# Patient Record
Sex: Male | Born: 1950 | Race: White | Hispanic: No | Marital: Married
Health system: Southern US, Community
[De-identification: ages and names within clinical notes are randomized; demographics above are authoritative.]

---

## 2003-12-18 ENCOUNTER — Other Ambulatory Visit: Payer: Self-pay

## 2006-09-07 ENCOUNTER — Other Ambulatory Visit: Payer: Self-pay

## 2006-09-07 ENCOUNTER — Emergency Department: Payer: Self-pay | Admitting: Emergency Medicine

## 2008-02-13 IMAGING — CR DG CHEST 1V PORT
1 series · 2 of 2 positions shown · non-contrast
Comparison: none

REASON FOR EXAM: cp
COMMENTS:

PROCEDURE:     DXR - DXR PORTABLE CHEST SINGLE VIEW  - September 07, 2006 [DATE]
RESULT:     AP view of the chest shows the lung fields to be clear.  The
heart, mediastinal and osseous structures reveal no significant
abnormalities.

[Series 1: view not recorded · 0.17mm/px · 2 of 2 slices shown]
[im 1/2]
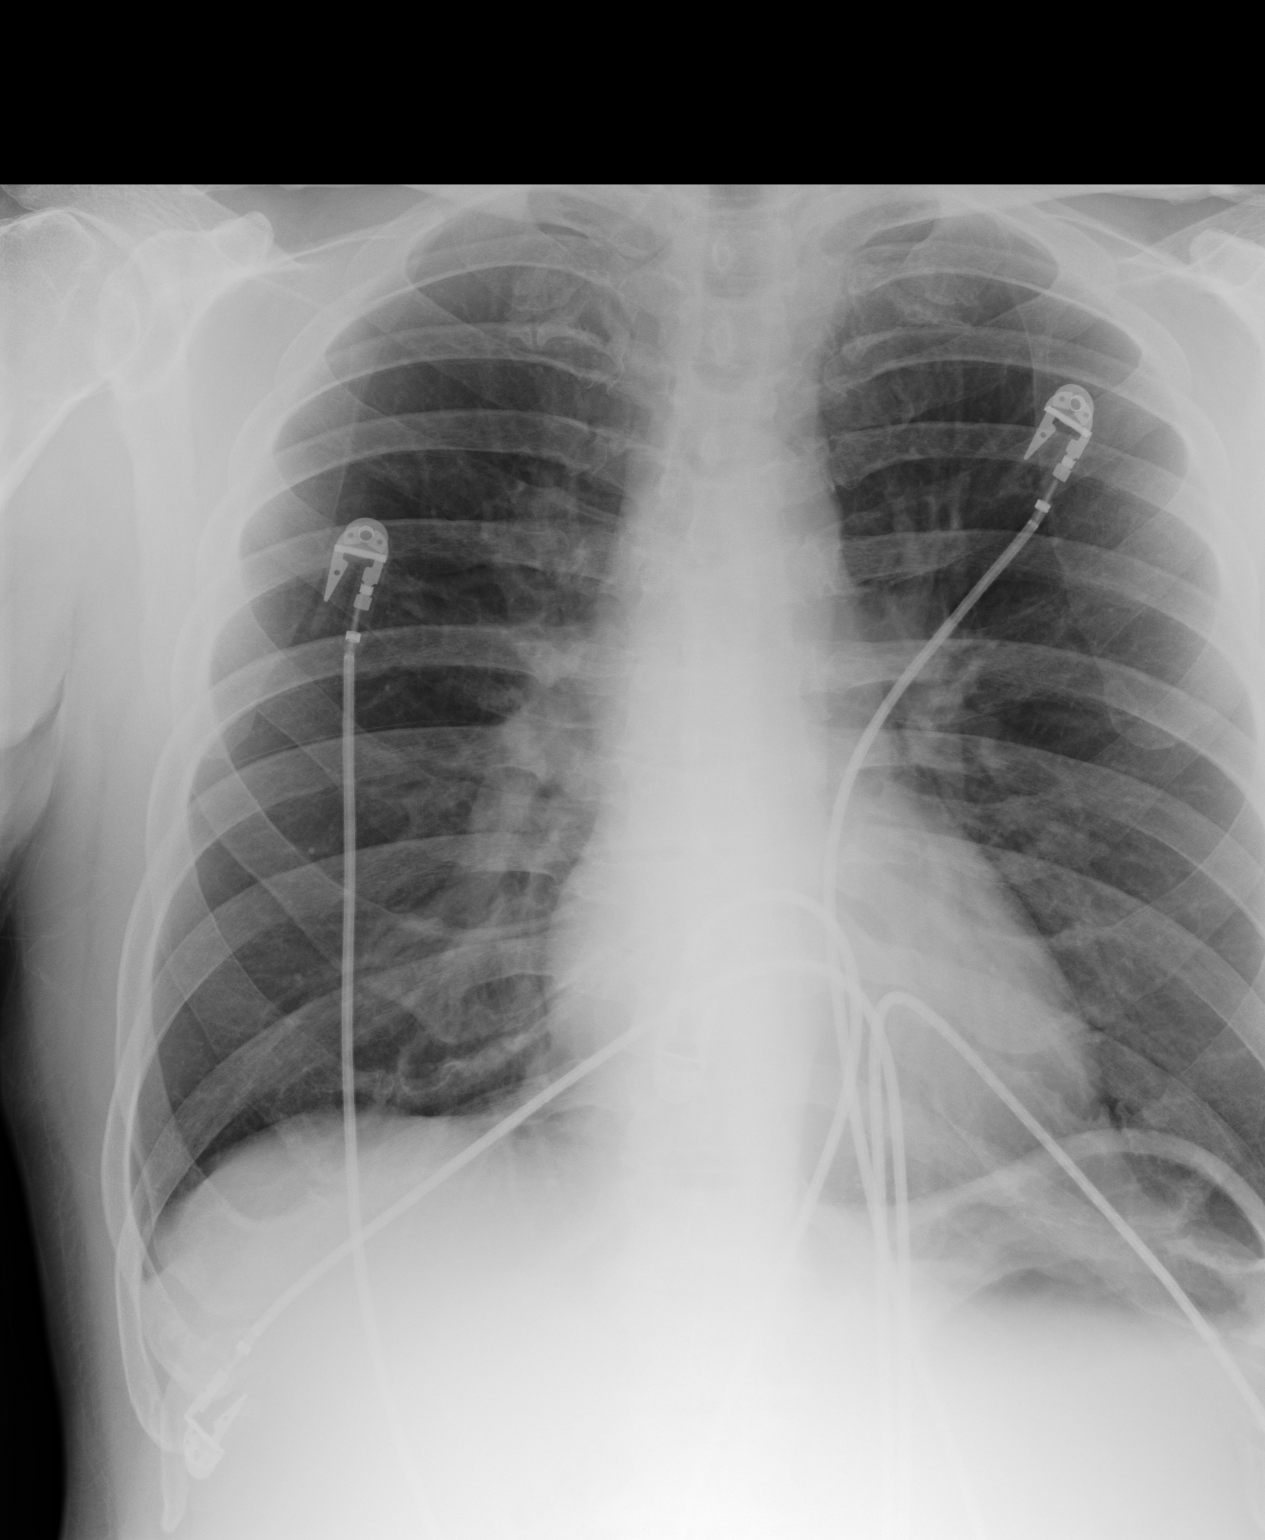
[im 2/2]
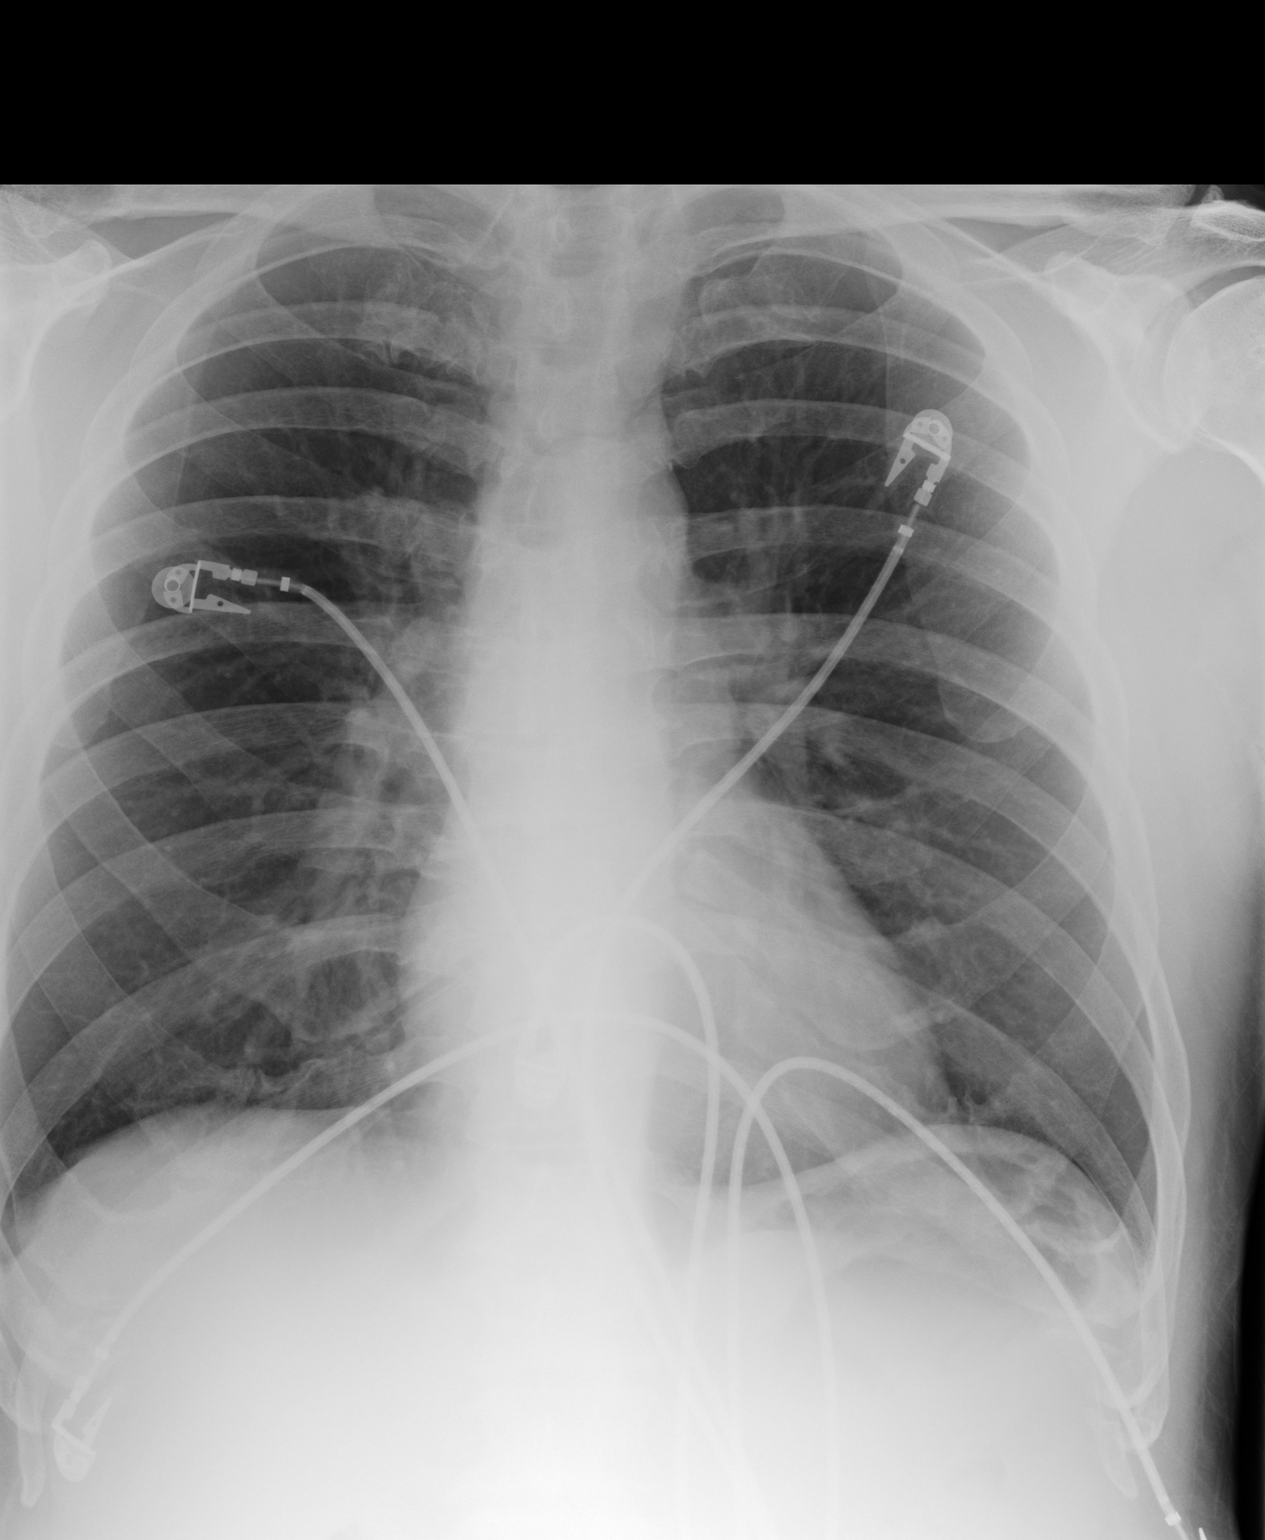

[2 of 2 positions shown; findings below may reference images not displayed]

IMPRESSION: No acute changes are identified.

Monitoring electrodes are present.

## 2011-01-25 ENCOUNTER — Ambulatory Visit: Payer: Self-pay | Admitting: Unknown Physician Specialty

## 2011-04-06 ENCOUNTER — Observation Stay: Payer: Self-pay | Admitting: Internal Medicine

## 2012-09-10 IMAGING — CR DG CHEST 1V PORT
1 series · 1 of 1 positions shown · non-contrast
Comparison: none

REASON FOR EXAM: cp
COMMENTS:

PROCEDURE:     DXR - DXR PORTABLE CHEST SINGLE VIEW  - April 05, 2011 [DATE]
RESULT:     The lung fields are clear. No pneumonia, pneumothorax or pleural
effusion is seen. Heart size is normal. Monitoring electrodes are present.

[view not recorded]
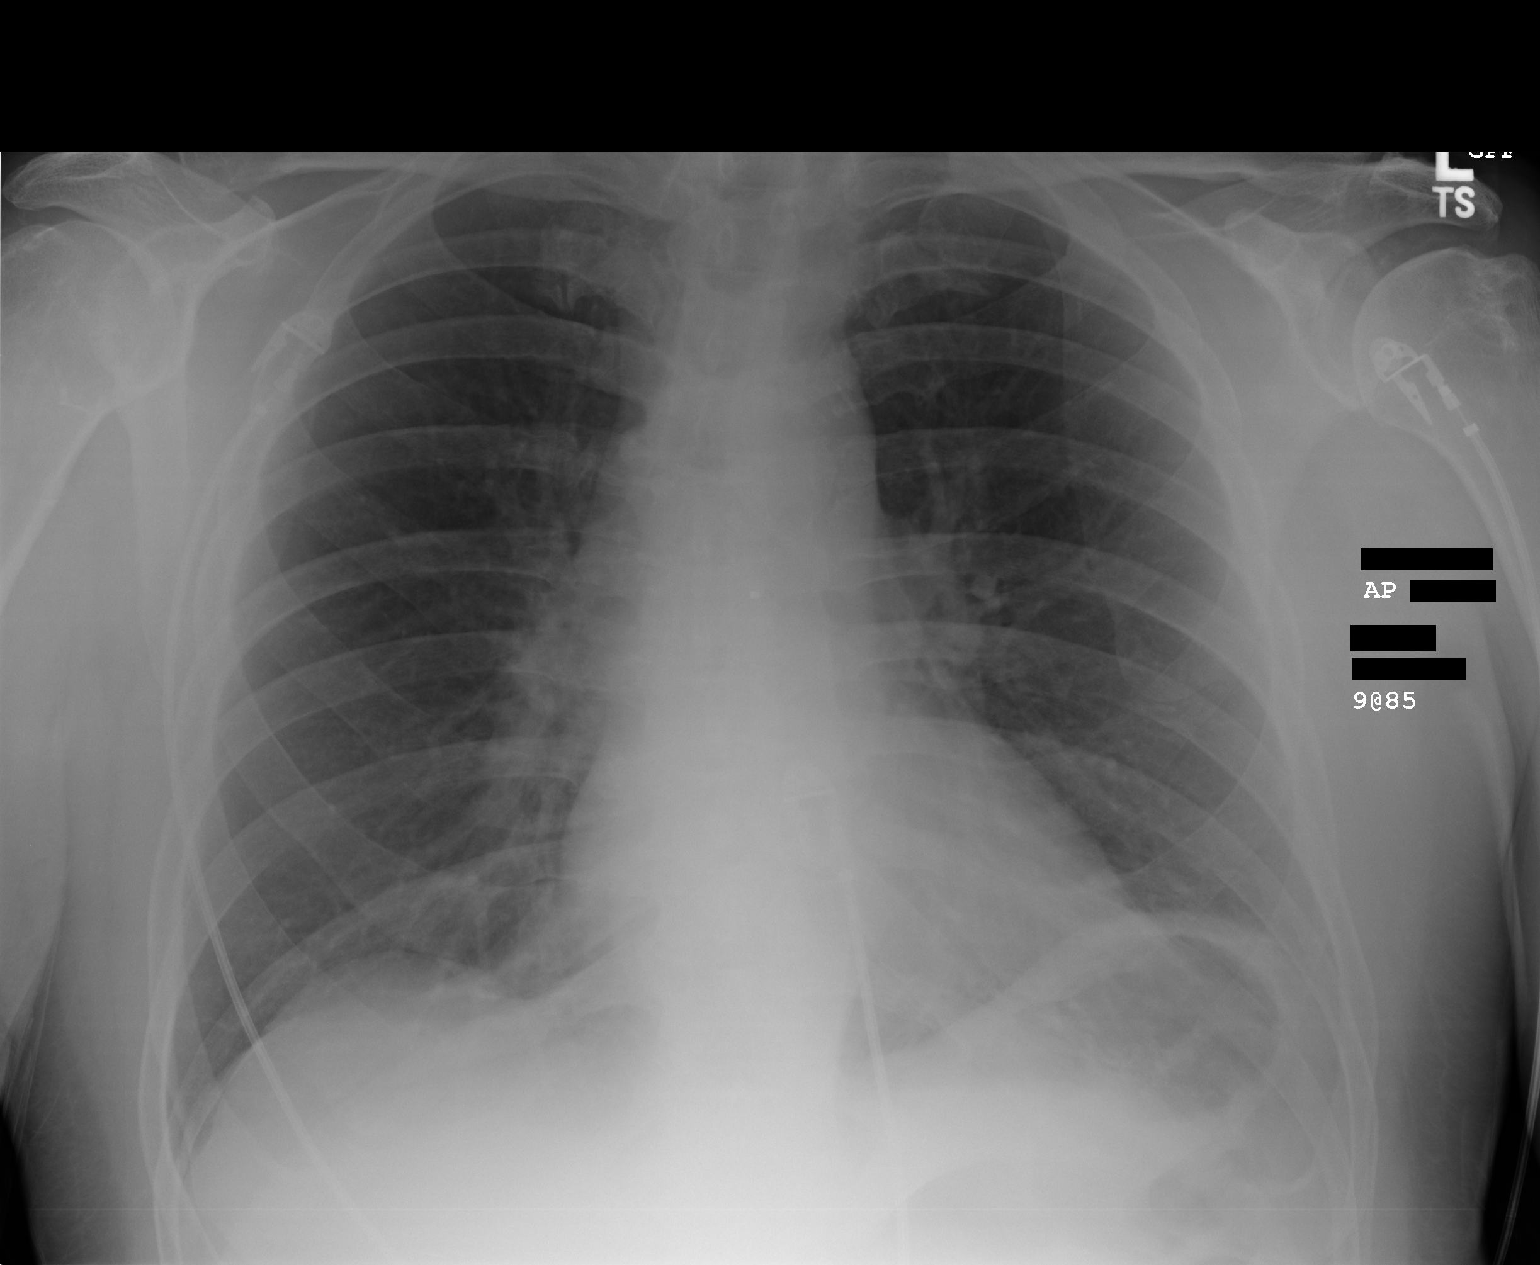

[1 of 1 positions shown; findings below may reference images not displayed]

IMPRESSION: No acute changes are identified.

## 2015-03-20 ENCOUNTER — Other Ambulatory Visit (HOSPITAL_COMMUNITY): Payer: Self-pay | Admitting: Specialist

## 2015-03-20 ENCOUNTER — Ambulatory Visit (HOSPITAL_COMMUNITY)
Admission: RE | Admit: 2015-03-20 | Discharge: 2015-03-20 | Disposition: A | Discharge status: Home / Self Care | Payer: Worker's Compensation | Source: Ambulatory Visit | Attending: Internal Medicine | Admitting: Internal Medicine

## 2015-03-20 DIAGNOSIS — M7989 Other specified soft tissue disorders: Secondary | ICD-10-CM | POA: Diagnosis present

## 2015-03-20 DIAGNOSIS — R609 Edema, unspecified: Secondary | ICD-10-CM

## 2015-03-20 NOTE — Progress Notes (Signed)
Right lower extremity venous duplex completed.  Right:  No evidence of DVT, superficial thrombosis, or Baker's cyst.  Left:  Negative for DVT in the common femoral vein.  

## 2017-12-09 ENCOUNTER — Telehealth: Payer: Self-pay

## 2017-12-09 NOTE — Telephone Encounter (Signed)
I left a message to the patient asking to call me back.
